# Patient Record
Sex: Female | Born: 1980 | Race: Black or African American | Hispanic: No | Marital: Single | Smoking: Never smoker
Health system: Southern US, Community
[De-identification: ages and names within clinical notes are randomized; demographics above are authoritative.]

## PROBLEM LIST (undated history)

## (undated) ENCOUNTER — Inpatient Hospital Stay (HOSPITAL_COMMUNITY): Payer: Self-pay

## (undated) DIAGNOSIS — N200 Calculus of kidney: Secondary | ICD-10-CM

## (undated) HISTORY — PX: OTHER SURGICAL HISTORY: SHX169

---

## 2018-10-21 ENCOUNTER — Encounter (HOSPITAL_COMMUNITY): Payer: Self-pay | Admitting: Emergency Medicine

## 2018-10-21 ENCOUNTER — Other Ambulatory Visit: Payer: Self-pay

## 2018-10-21 DIAGNOSIS — O26891 Other specified pregnancy related conditions, first trimester: Secondary | ICD-10-CM | POA: Insufficient documentation

## 2018-10-21 DIAGNOSIS — Z3A01 Less than 8 weeks gestation of pregnancy: Secondary | ICD-10-CM | POA: Insufficient documentation

## 2018-10-21 DIAGNOSIS — R1032 Left lower quadrant pain: Secondary | ICD-10-CM | POA: Insufficient documentation

## 2018-10-21 MED ORDER — SODIUM CHLORIDE 0.9% FLUSH: 3.0000 mL | Freq: Once | INTRAVENOUS | Status: DC

## 2018-10-21 NOTE — ED Triage Notes (Signed)
Per family, patient c/o lower abdominal pain x3 days. Denies N/V/D. Reports blood in urine only in the morning x3 days. Patient is french speaking.

## 2018-10-22 ENCOUNTER — Emergency Department (HOSPITAL_COMMUNITY)
Admission: EM | Admit: 2018-10-22 | Discharge: 2018-10-22 | Disposition: A | Discharge status: Home / Self Care | Payer: Self-pay | Attending: Emergency Medicine | Admitting: Emergency Medicine

## 2018-10-22 DIAGNOSIS — Z3A01 Less than 8 weeks gestation of pregnancy: Secondary | ICD-10-CM

## 2018-10-22 DIAGNOSIS — R1030 Lower abdominal pain, unspecified: Secondary | ICD-10-CM

## 2018-10-22 LAB — CBC
HCT: 41 % (ref 36.0–46.0)
Hemoglobin: 13.7 g/dL (ref 12.0–15.0)
MCH: 31.1 pg (ref 26.0–34.0)
MCHC: 33.4 g/dL (ref 30.0–36.0)
MCV: 93.2 fL (ref 80.0–100.0)
Platelets: 455 10*3/uL — ABNORMAL HIGH (ref 150–400)
RBC: 4.4 MIL/uL (ref 3.87–5.11)
RDW: 12.6 % (ref 11.5–15.5)
WBC: 6.7 10*3/uL (ref 4.0–10.5)
nRBC: 0 % (ref 0.0–0.2)

## 2018-10-22 LAB — WET PREP, GENITAL
Clue Cells Wet Prep HPF POC: NONE SEEN
SPERM: NONE SEEN
Trich, Wet Prep: NONE SEEN
Yeast Wet Prep HPF POC: NONE SEEN

## 2018-10-22 LAB — URINALYSIS, ROUTINE W REFLEX MICROSCOPIC
Bilirubin Urine: NEGATIVE
GLUCOSE, UA: NEGATIVE mg/dL
Ketones, ur: NEGATIVE mg/dL
Leukocytes,Ua: NEGATIVE
Nitrite: NEGATIVE
Protein, ur: NEGATIVE mg/dL
Specific Gravity, Urine: 1.013 (ref 1.005–1.030)
pH: 6 (ref 5.0–8.0)

## 2018-10-22 LAB — COMPREHENSIVE METABOLIC PANEL
ALT: 8 U/L (ref 0–44)
AST: 16 U/L (ref 15–41)
Albumin: 4.5 g/dL (ref 3.5–5.0)
Alkaline Phosphatase: 52 U/L (ref 38–126)
Anion gap: 7 (ref 5–15)
BUN: 7 mg/dL (ref 6–20)
CO2: 25 mmol/L (ref 22–32)
CREATININE: 0.6 mg/dL (ref 0.44–1.00)
Calcium: 9.8 mg/dL (ref 8.9–10.3)
Chloride: 104 mmol/L (ref 98–111)
GFR calc Af Amer: >60 mL/min (ref >60)
GFR calc non Af Amer: >60 mL/min (ref >60)
Glucose, Bld: 101 mg/dL — ABNORMAL HIGH (ref 70–99)
Potassium: 3.7 mmol/L (ref 3.5–5.1)
Sodium: 136 mmol/L (ref 135–145)
TOTAL PROTEIN: 7.9 g/dL (ref 6.5–8.1)
Total Bilirubin: 0.5 mg/dL (ref 0.3–1.2)

## 2018-10-22 LAB — I-STAT BETA HCG BLOOD, ED (MC, WL, AP ONLY): I-stat hCG, quantitative: >2000 m[IU]/mL — ABNORMAL HIGH (ref <5)

## 2018-10-22 LAB — LIPASE, BLOOD: Lipase: 33 U/L (ref 11–51)

## 2018-10-22 MED ORDER — PRENATAL VITAMINS 28-0.8 MG PO TABS: 1.0000 | ORAL_TABLET | Freq: Every day | ORAL | 0 refills | Status: DC

## 2018-10-22 NOTE — ED Provider Notes (Signed)
[facility]-EMERGENCY DEPT Provider Note   CSN: [id number] Arrival date & time: 10/21/18  1945    History   Chief Complaint Chief Complaint  Patient presents with  . Abdominal Pain    HPI Debbie Haley is a 38 y.o. female.     The history is provided by the patient. A language interpreter was used [phone]).  Abdominal Pain  Pain location:  LLQ and RLQ Pain quality: aching   Pain severity:  Moderate Onset quality:  Gradual Duration:  3 days Timing:  Intermittent Progression:  Waxing and waning Chronicity:  New Relieved by:  None tried Worsened by:  Nothing Associated symptoms: hematuria   Associated symptoms: no fever, no vaginal bleeding, no vaginal discharge and no vomiting   pt is currently pain free She knew that she is pregnant but has not seen OBGYN   PMH-none Soc hx - nonsmoker  Surgical hx - GB surgery She has 3 children LMP - 08/28/2018 Used to live in [place] OB History   No obstetric history on file.      Home Medications    Prior to Admission medications   Not on File    Family History No family history on file.  Social History Social History   Tobacco Use  . Smoking status: Not on file  Substance Use Topics  . Alcohol use: Not on file  . Drug use: Not on file     Allergies   Patient has no known allergies.   Review of Systems Review of Systems  Constitutional: Negative for fever.  Gastrointestinal: Positive for abdominal pain. Negative for vomiting.  Genitourinary: Positive for hematuria. Negative for vaginal bleeding and vaginal discharge.  All other systems reviewed and are negative.    Physical Exam Updated Vital Signs BP 138/85 (BP Location: Right Arm)   Pulse 87   Temp 98.3 F (36.8 C) (Oral)   Resp 19   Ht 1.6 m (5\' 3" )   Wt 82.6 kg   LMP 08/28/2018   SpO2 100%   BMI 32.24 kg/m   Physical Exam CONSTITUTIONAL: Well developed/well nourished HEAD: Normocephalic/atraumatic EYES:  EOMI/PERRL ENMT: Mucous membranes moist NECK: supple no meningeal signs SPINE/BACK:entire spine nontender CV: S1/S2 noted, no murmurs/rubs/gallops noted LUNGS: Lungs are clear to auscultation bilaterally, no apparent distress ABDOMEN: soft, nontender, no rebound or guarding, bowel sounds noted throughout abdomen GU:no cva tenderness, no CMT, no vaginal bleeding.  No products of conception.  No vaginal discharge.  No adnexal tenderness or mass.  Female nurse chaperone present for exam NEURO: Pt is awake/alert/appropriate, moves all extremitiesx4.  No facial droop.   EXTREMITIES: pulses normal/equal, full ROM SKIN: warm, color normal PSYCH: no abnormalities of mood noted, alert and oriented to situation   ED Treatments / Results  Labs (all labs ordered are listed, but only abnormal results are displayed) Labs Reviewed  WET PREP, GENITAL - Abnormal; Notable for the following components:      Result Value   WBC, Wet Prep HPF POC FEW (*)    All other components within normal limits  COMPREHENSIVE METABOLIC PANEL - Abnormal; Notable for the following components:   Glucose, Bld 101 (*)    All other components within normal limits  CBC - Abnormal; Notable for the following components:   Platelets 455 (*)    All other components within normal limits  URINALYSIS, ROUTINE W REFLEX MICROSCOPIC - Abnormal; Notable for the following components:   APPearance HAZY (*)    Hgb urine dipstick SMALL (*)  Bacteria, UA RARE (*)    All other components within normal limits  I-STAT BETA HCG BLOOD, ED ([facility], [facility], [facility] ONLY) - Abnormal; Notable for the following components:   I-stat hCG, quantitative >2,000.0 (*)    All other components within normal limits  LIPASE, BLOOD  GC/CHLAMYDIA PROBE AMP ([place]) NOT AT [facility]    EKG None  Radiology No results found.  Procedures Ultrasound ED OB Pelvic Date/Time: 10/22/2018 2:36 AM Performed by: Zadie Rhine, MD Authorized by: Zadie Rhine, MD     Procedure details:    Indications: evaluate for IUP     Assess:  Intrauterine pregnancy   Technique:  Transabdominal obstetric (HCG+) exam   Images: archived   Study Limitations: body habitus Uterine findings:    Yolk sac: identified     Estimated gestational age: 74 weeks    Medications Ordered in ED Medications  sodium chloride flush (NS) 0.9 % injection 3 mL (has no administration in time range)     Initial Impression / Assessment and Plan / ED Course  I have reviewed the triage vital signs and the nursing notes.  Pertinent labs & imaging results that were available during my care of the patient were reviewed by me and considered in my medical decision making (see chart for details).        2:36 AM Patient reports lower abdominal pain intermittently for the past 3 days.  She reports she is noted blood in her urine.  She denies any vaginal complaints.  She is currently pain-free. Limited bedside ultrasound reveals IUP with yolk sac.  Approximately 6 weeks.  By LMP she is 7 weeks. 3:42 AM Patient stable.  No new pain complaints.  She will be discharged home. She reports she will be heading back to [place] in the next month, but would like referral to local OB/GYN Final Clinical Impressions(s) / ED Diagnoses   Final diagnoses:  Lower abdominal pain  Less than [redacted] weeks gestation of pregnancy    ED Discharge Orders    None       Zadie Rhine, MD 10/22/18 [phone]

## 2018-10-22 NOTE — ED Notes (Signed)
Pelvic cart ready. 

## 2018-10-23 LAB — GC/CHLAMYDIA PROBE AMP (~~LOC~~) NOT AT ARMC
Chlamydia: NEGATIVE
Neisseria Gonorrhea: NEGATIVE

## 2018-11-05 ENCOUNTER — Encounter (HOSPITAL_COMMUNITY): Payer: Self-pay | Admitting: *Deleted

## 2018-11-05 ENCOUNTER — Other Ambulatory Visit: Payer: Self-pay

## 2018-11-05 ENCOUNTER — Inpatient Hospital Stay (HOSPITAL_COMMUNITY): Payer: Self-pay

## 2018-11-05 ENCOUNTER — Inpatient Hospital Stay (HOSPITAL_COMMUNITY)
Admission: AD | Admit: 2018-11-05 | Discharge: 2018-11-05 | Disposition: A | Discharge status: Home / Self Care | Payer: Self-pay | Attending: Obstetrics and Gynecology | Admitting: Obstetrics and Gynecology

## 2018-11-05 DIAGNOSIS — O209 Hemorrhage in early pregnancy, unspecified: Secondary | ICD-10-CM

## 2018-11-05 DIAGNOSIS — Z3A09 9 weeks gestation of pregnancy: Secondary | ICD-10-CM | POA: Insufficient documentation

## 2018-11-05 DIAGNOSIS — Z6790 Unspecified blood type, Rh positive: Secondary | ICD-10-CM

## 2018-11-05 DIAGNOSIS — O360110 Maternal care for anti-D [Rh] antibodies, first trimester, not applicable or unspecified: Secondary | ICD-10-CM

## 2018-11-05 DIAGNOSIS — O200 Threatened abortion: Secondary | ICD-10-CM | POA: Insufficient documentation

## 2018-11-05 HISTORY — DX: Calculus of kidney: N20.0

## 2018-11-05 LAB — CBC
HCT: 39.9 % (ref 36.0–46.0)
Hemoglobin: 13.1 g/dL (ref 12.0–15.0)
MCH: 29.4 pg (ref 26.0–34.0)
MCHC: 32.8 g/dL (ref 30.0–36.0)
MCV: 89.5 fL (ref 80.0–100.0)
Platelets: 387 10*3/uL (ref 150–400)
RBC: 4.46 MIL/uL (ref 3.87–5.11)
RDW: 11.9 % (ref 11.5–15.5)
WBC: 4.3 10*3/uL (ref 4.0–10.5)
nRBC: 0 % (ref 0.0–0.2)

## 2018-11-05 LAB — URINALYSIS, ROUTINE W REFLEX MICROSCOPIC
Bacteria, UA: NONE SEEN
Bilirubin Urine: NEGATIVE
Glucose, UA: NEGATIVE mg/dL
Ketones, ur: NEGATIVE mg/dL
Leukocytes,Ua: NEGATIVE
Nitrite: NEGATIVE
Protein, ur: NEGATIVE mg/dL
Specific Gravity, Urine: 1.015 (ref 1.005–1.030)
pH: 6 (ref 5.0–8.0)

## 2018-11-05 LAB — ABO/RH: ABO/RH(D): A POS

## 2018-11-05 MED ORDER — PRENATAL VITAMINS 28-0.8 MG PO TABS: 1.0000 | ORAL_TABLET | Freq: Every day | ORAL | 0 refills | Status: DC

## 2018-11-05 MED ORDER — PRENATAL VITAMINS 28-0.8 MG PO TABS: 1.0000 | ORAL_TABLET | Freq: Every day | ORAL | 5 refills | Status: AC

## 2018-11-05 MED ORDER — TRAMADOL HCL 50 MG PO TABS: 50.0000 mg | ORAL_TABLET | Freq: Four times a day (QID) | ORAL | 0 refills | Status: DC | PRN

## 2018-11-05 MED ORDER — TRAMADOL HCL 50 MG PO TABS: 50.0000 mg | ORAL_TABLET | Freq: Four times a day (QID) | ORAL | 0 refills | Status: AC | PRN

## 2018-11-05 NOTE — MAU Provider Note (Signed)
Chief Complaint: Vaginal Bleeding and Abdominal Pain   First Provider Initiated Contact with Patient 11/05/18 1052      SUBJECTIVE HPI: Debbie Haley is a 38 y.o. G4P3003 at [redacted]w[redacted]d by LMP who presents to maternity admissions reporting bleeding with urinating and abdominal cramping associated with urinating.  She was seen in the ED on 10/22/18 with cramping and had bedside US with gestational sac and yolk sac, measuring 6 weeks by gestational sac size.  Her pain improved and there was no bleeding until today.  This morning, she started having light red bleeding when wiping and mild lower abdominal cramping after each time she urinates. She denies pain now.  There are no other symptoms. She has not tried any treatments.    HPI  Past Medical History:  Diagnosis Date  . Kidney stones    Past Surgical History:  Procedure Laterality Date  . OTHER SURGICAL HISTORY     removal of kidney stone   Social History   Socioeconomic History  . Marital status: Single    Spouse name: Not on file  . Number of children: Not on file  . Years of education: Not on file  . Highest education level: Not on file  Occupational History  . Not on file  Social Needs  . Financial resource strain: Not on file  . Food insecurity:    Worry: Not on file    Inability: Not on file  . Transportation needs:    Medical: Not on file    Non-medical: Not on file  Tobacco Use  . Smoking status: Never Smoker  . Smokeless tobacco: Never Used  Substance and Sexual Activity  . Alcohol use: Never    Frequency: Never  . Drug use: Never  . Sexual activity: Yes  Lifestyle  . Physical activity:    Days per week: Not on file    Minutes per session: Not on file  . Stress: Not on file  Relationships  . Social connections:    Talks on phone: Not on file    Gets together: Not on file    Attends religious service: Not on file    Active member of club or organization: Not on file    Attends meetings of clubs or organizations:  Not on file    Relationship status: Not on file  . Intimate partner violence:    Fear of current or ex partner: Not on file    Emotionally abused: Not on file    Physically abused: Not on file    Forced sexual activity: Not on file  Other Topics Concern  . Not on file  Social History Narrative  . Not on file   No current facility-administered medications on file prior to encounter.    Current Outpatient Medications on File Prior to Encounter  Medication Sig Dispense Refill  . Prenatal Vit-Fe Fumarate-FA (PRENATAL VITAMINS) 28-0.8 MG TABS Take 1 tablet by mouth daily. 30 tablet 0   No Known Allergies  ROS:  Review of Systems  Constitutional: Negative for chills, fatigue and fever.  Respiratory: Negative for shortness of breath.   Cardiovascular: Negative for chest pain.  Gastrointestinal: Positive for abdominal pain.  Genitourinary: Positive for dysuria and vaginal bleeding. Negative for difficulty urinating, flank pain, pelvic pain, vaginal discharge and vaginal pain.  Neurological: Negative for dizziness and headaches.  Psychiatric/Behavioral: Negative.      I have reviewed patient's Past Medical Hx, Surgical Hx, Family Hx, Social Hx, medications and allergies.   Physical Exam  Patient Vitals for the past 24 hrs:  BP Temp Temp src Pulse Resp SpO2 Height Weight  11/05/18 0938 133/86 98.2 F (36.8 C) Oral (!) 102 18 100 % - 81.2 kg   Constitutional: Well-developed, well-nourished female in no acute distress.  Cardiovascular: normal rate Respiratory: normal effort GI: Abd soft, non-tender. Pos BS x 4 MS: Extremities nontender, no edema, normal ROM Neurologic: Alert and oriented x 4.  GU: Neg CVAT.  PELVIC EXAM: Deferred, done 10/22/18 with cultures   LAB RESULTS Results for orders placed or performed during the hospital encounter of 11/05/18 (from the past 24 hour(s))  CBC     Status: None   Collection Time: 11/05/18 10:32 AM  Result Value Ref Range   WBC 4.3  4.0 - 10.5 K/uL   RBC 4.46 3.87 - 5.11 MIL/uL   Hemoglobin 13.1 12.0 - 15.0 g/dL   HCT 16.1 09.6 - 04.5 %   MCV 89.5 80.0 - 100.0 fL   MCH 29.4 26.0 - 34.0 pg   MCHC 32.8 30.0 - 36.0 g/dL   RDW 40.9 81.1 - 91.4 %   Platelets 387 150 - 400 K/uL   nRBC 0.0 0.0 - 0.2 %  ABO/Rh     Status: None   Collection Time: 11/05/18 10:32 AM  Result Value Ref Range   ABO/RH(D) A POS    No rh immune globuloin      NOT A RH IMMUNE GLOBULIN CANDIDATE, PT RH POSITIVE Performed at [facility], [address] [place], [place] [address]     --/--/A POS (03/05 1032)  IMAGING  US Ob Less Than 14 Weeks With Ob Transvaginal  Result Date: 11/05/2018 CLINICAL DATA:  Vaginal bleeding in 1st trimester pregnancy. EXAM: OBSTETRIC <14 WK [place] AND TRANSVAGINAL OB US TECHNIQUE: Both transabdominal and transvaginal ultrasound examinations were performed for complete evaluation of the gestation as well as the maternal uterus, adnexal regions, and pelvic cul-de-sac. Transvaginal technique was performed to assess early pregnancy. COMPARISON:  None. FINDINGS: Intrauterine gestational sac: Single; irregular sac shape noted Yolk sac:  Visualized. Embryo:  Not Visualized. MSD: 15 mm   6 w   2 d Subchorionic hemorrhage:  None visualized. Maternal uterus/adnexae: Both ovaries are normal in appearance. No mass or abnormal free fluid identified. IMPRESSION: Single intrauterine gestational sac measuring 6 weeks 2 days by mean sac diameter. Irregular sac shape is a poor prognostic sign. Suggest correlation with serial b-hCG levels, and consider followup ultrasound to assess viability in 10 days. Electronically Signed   By: Myles Rosenthal M.D.   On: 11/05/2018 12:22   MAU Management/MDM: Orders Placed This Encounter  Procedures  . US OB Transvaginal  . Urinalysis, Routine w reflex microscopic  . CBC  . ABO/Rh    No orders of the defined types were placed in this encounter.   [place] today suspicious for but not definitive for  failed pregnancy.  Discussed results with pt, questions answered. Outpatient [place] in 10 days for confirmation. Pt to return to MAU with heavy bleeding or severe pain. Pt concerned about pain at home.  Rx for Tramadol 50 mg Q 6 hours PRN x 10 tabs only. Pt discharged with strict bleeding/miscarriage precautions.  ASSESSMENT  1. Threatened miscarriage in early pregnancy   2. Vaginal bleeding in pregnancy, first trimester   3. Blood type, Rh positive     PLAN Discharge home    Allergies as of 11/05/2018   No Known Allergies     Medication List  TAKE these medications   Prenatal Vitamins 28-0.8 MG Tabs Take 1 tablet by mouth daily. What changed:  You were already taking a medication with the same name, and this prescription was added. Make sure you understand how and when to take each.   Prenatal Vitamins 28-0.8 MG Tabs Take 1 tablet by mouth daily. What changed:  Another medication with the same name was added. Make sure you understand how and when to take each.   traMADol 50 MG tablet Commonly known as:  ULTRAM Take 1 tablet (50 mg total) by mouth every 6 (six) hours as needed.       Sharen Counter Certified Nurse-Midwife 11/05/2018  11:40 AM

## 2018-11-05 NOTE — Discharge Instructions (Signed)
Fausse couche menace   Une fausse couche menace survient lorsqu'une femme a des saignements vaginaux au cours des 20 premires semaines de grossesse mais que la grossesse n'est pas termine. Si vous avez des saignements vaginaux pendant cette priode, votre professionnel de la sant fera des tests pour vous assurer que vous tes toujours enceinte. Si les tests montrent que vous tes toujours enceinte et [employer] bb en dveloppement (ftus)  l'intrieur de votre utrus continue de grandir, votre tat est considr comme une fausse couche menace. Une fausse couche menace ne signifie pas que votre grossesse prendra fin, mais elle augmente le risque de perdre votre grossesse (fausse couche complte). Quelles en [employer] causes? La cause de cette condition n'est gnralement pas connue. Pour les femmes qui font une fausse couche complte, la cause la plus frquente est un nombre anormal de chromosomes chez le bb en dveloppement. Les chromosomes [employer] structures  l'intrieur des cellules qui contiennent tout le matriel gntique [place]. Qu'est-ce qui [employer]? Les facteurs de style de vie suivants peuvent augmenter votre risque de fausse couche en dbut de grossesse:  Fumer.  Boire des [employer] ou de cafine.  Consommation de drogues  des fins rcratives. Les problmes de sant prexistants suivants peuvent augmenter le risque de fausse couche en dbut de grossesse:  Syndrome des [employer].  Fibromes utrins.  Infections.  Diabte sucr. Quels sont les signes ou symptmes? Les symptmes de cette condition comprennent:  Saignement vaginal.  Douleurs abdominales ou crampes lgres. Comment est-ce diagnostiqu? Si vous avez des [employer] ou sans douleur abdominale avant 20 semaines de [place], votre professionnel de la sant fera des tests pour vrifier si vous tes encore enceinte. Ceux-ci comprendront:  chographie.  Ce test utilise des [employer] crer [employer] de l'intrieur de votre utrus. Cela permet  votre fournisseur de soins de sant de regarder votre bb en dveloppement et d'autres structures, comme votre placenta.  Examen pelvien. Il s'agit d'un examen interne de votre vagin et de votre col de l'utrus.  Mesure de la frquence cardiaque de votre bb.  Des tests de laboratoire tels que des tests sanguins, des tests d'urine ou des couvillons pour l'infection Vous pouvez tre [employer] menace de fausse couche si:  L'chographie montre que vous tes toujours enceinte.  La frquence cardiaque de votre bb est forte.  Un examen pelvien montre que l'ouverture entre votre utrus et votre vagin (col de l'utrus) est ferme.  Les analyses de sang confirment que vous tes toujours enceinte. Comment est-ce trait? Aucun traitement n'a t dmontr pour [employer] fausse couche menace de Ambulance person en fausse couche complte. Cependant, les bons soins  domicile [employer]. Suivez ces instructions  la maison:  Prenez beaucoup de repos.  N'ayez pas de relations sexuelles et n'utilisez pas de tampons si vous avez des saignements vaginaux.  Ne douchez pas.  Ne fumez pas et n'utilisez pas de drogues rcratives.  Ne buvez pas d'alcool.  vitez la cafine.  Gardez toutes les visites prnatales de suivi comme indiqu par votre fournisseur de soins de sant. C'est important. Contactez un fournisseur de Conservator, museum/gallery de sant si:  Vous avez des saignements vaginaux lgers ou des Arts development officer.  Vous avez des [employer] ou des crampes.  Tu as de Social research officer, government. [place] de l'aide immdiatement si:  Vous avez des saignements vaginaux abondants.  Vous avez des caillots sanguins provenant de votre vagin.  Vous passez des tissus de  votre vagin.  Vous fuyez du liquide ou vous avez un jet de [employer] vagin.  Vous avez de graves douleurs au  bas du dos ou des crampes abdominales.  Vous avez de la fivre, des frissons et de fortes douleurs abdominales. Sommaire  Une fausse couche menace survient lorsqu'une femme a des saignements vaginaux au cours des 20 premires semaines de grossesse mais que la grossesse n'est pas termine.  La cause d'une menace de fausse couche n'est gnralement pas connue.  Les symptmes de cette affection peuvent inclure des saignements vaginaux et de lgres douleurs ou crampes abdominales.  Aucun traitement n'a t dmontr pour [employer] fausse couche menace de Ambulance person en fausse couche complte.  Gardez toutes les visites prnatales de suivi comme indiqu par votre fournisseur de soins de sant. C'est important. Ces informations ne sont pas destines  remplacer les conseils donns par votre fournisseur de soins de sant. Assurez-vous de Scientific laboratory technician de vos questions avec votre professionnel de la sant.

## 2018-11-05 NOTE — MAU Note (Signed)
Is pregnant. This morning when she went to the bathroom, she noted blood (looked like a period mixed with water)and is having abd pain. (lower abd, like period cramps)

## 2018-11-06 ENCOUNTER — Inpatient Hospital Stay (HOSPITAL_COMMUNITY)
Admission: AD | Admit: 2018-11-06 | Discharge: 2018-11-06 | Disposition: A | Discharge status: Home / Self Care | Payer: Self-pay | Attending: Obstetrics and Gynecology | Admitting: Obstetrics and Gynecology

## 2018-11-06 ENCOUNTER — Other Ambulatory Visit: Payer: Self-pay

## 2018-11-06 DIAGNOSIS — O200 Threatened abortion: Secondary | ICD-10-CM

## 2018-11-06 DIAGNOSIS — O4691 Antepartum hemorrhage, unspecified, first trimester: Secondary | ICD-10-CM | POA: Insufficient documentation

## 2018-11-06 DIAGNOSIS — Z3A10 10 weeks gestation of pregnancy: Secondary | ICD-10-CM | POA: Insufficient documentation

## 2018-11-06 LAB — HCG, QUANTITATIVE, PREGNANCY: hCG, Beta Chain, Quant, S: 27429 m[IU]/mL — ABNORMAL HIGH

## 2018-11-06 NOTE — MAU Note (Signed)
Here for the same reason.  She is still bleeding, she is not "healed".  Pt does not understand.   (attempting to explain "threatened miscarriage" through translator, that we do not have a definite answer, there is not a treatment).  Bleeding is heavier, passing blood clots now, is like a period when she urinates. cramping

## 2018-11-06 NOTE — MAU Provider Note (Signed)
[name] [phone]   S: Debbie Haley is a 38 y.o. G4P3003 at [redacted]w[redacted]d by LMP, but 6.2 weeks by [place] on 11/05/2018.  She presents today for continued vaginal bleeding.  Patient speaks [place] and interpretations were completed with assistance of [name] [phone] and later by patient's sister Debbie Haley.  She reports that she continues to bleed and expresses concern about the pregnancy.  Patient states she attempted to schedule an [place], but was unable to contact anyone.  She denies pain at current and endorses that the bleeding is no more than yesterday.    O: Vitals:   11/06/18 1620  BP: 122/75  Pulse: 83  Resp: 18  Temp: 98.3 F (36.8 C)  SpO2: 100%   US FINDINGS from 11/05/2018: Intrauterine gestational sac: Single; irregular sac shape noted  Yolk sac:  Visualized.  Embryo:  Not Visualized.  MSD: 15 mm   6 w   2 d  Subchorionic hemorrhage:  None visualized.  Maternal uterus/adnexae: Both ovaries are normal in appearance. No mass or abnormal free fluid identified.  IMPRESSION: Single intrauterine gestational sac measuring 6 weeks 2 days by mean sac diameter. Irregular sac shape is a poor prognostic sign. Suggest correlation with serial b-hCG levels, and consider follow up ultrasound to assess viability in 10 days.  Results for orders placed or performed during the hospital encounter of 11/06/18 (from the past 24 hour(s))  hCG, quantitative, pregnancy     Status: Abnormal   Collection Time: 11/06/18  3:54 PM  Result Value Ref Range   hCG, Beta Chain, Quant, S 27,429 (H) <5 mIU/mL    A:  Intrauterine Gestational Sac Unknown Quant Level Vaginal Bleeding Language Barrier  P: -Extensive discussion regarding US findings and need for follow up.  -Patient questions regarding pregnancy and bleeding addressed and patient informed that based on LMP and [place] yesterday inconsistencies noted. -Patient informed that she could possibly be having a miscarriage, but additional testing is  necessary. -Patient informed that [place] will be scheduled after quants obtained and possible necessity for serial testing discussed. -Bleeding precautions given. -Patient scheduled on Monday at 1330 for repeat quant. -Quant ordered, collected, and pending. -Patient questions and advised to take PNV as ordered. -Discussed pelvic rest and patient reports her husband is not present currently.  -Patient without further questions or concerns. -Patient discharged to home in stable condition.  Cherre Robins, CNM 11/06/2018 4:31 PM   Addendum 5:28 PM -hCG returns at 27k -Attempted to contact patient regarding results and no need for follow up hCG. -Numbers on file, including sisters number called with no messages left. -Will send message to clinic for further attempts at contacting.   -Will order [place] for ~2 weeks from today.   Cherre Robins MSN, CNM

## 2018-11-09 ENCOUNTER — Other Ambulatory Visit (INDEPENDENT_AMBULATORY_CARE_PROVIDER_SITE_OTHER): Payer: Self-pay | Admitting: Obstetrics & Gynecology

## 2018-11-09 ENCOUNTER — Other Ambulatory Visit: Payer: Self-pay

## 2018-11-09 ENCOUNTER — Inpatient Hospital Stay (HOSPITAL_COMMUNITY)
Admission: AD | Admit: 2018-11-09 | Discharge: 2018-11-09 | Disposition: A | Discharge status: Home / Self Care | Payer: Self-pay | Attending: Obstetrics and Gynecology | Admitting: Obstetrics and Gynecology

## 2018-11-09 ENCOUNTER — Encounter (HOSPITAL_COMMUNITY): Payer: Self-pay | Admitting: *Deleted

## 2018-11-09 ENCOUNTER — Inpatient Hospital Stay (HOSPITAL_COMMUNITY): Payer: Self-pay

## 2018-11-09 ENCOUNTER — Other Ambulatory Visit (HOSPITAL_COMMUNITY)
Admission: RE | Admit: 2018-11-09 | Discharge: 2018-11-09 | Disposition: A | Discharge status: Home / Self Care | Payer: Self-pay | Source: Ambulatory Visit | Attending: Family Medicine | Admitting: Family Medicine

## 2018-11-09 VITALS — BP 138/90 | HR 99

## 2018-11-09 DIAGNOSIS — O360921 Maternal care for other rhesus isoimmunization, second trimester, fetus 1: Secondary | ICD-10-CM

## 2018-11-09 DIAGNOSIS — O209 Hemorrhage in early pregnancy, unspecified: Secondary | ICD-10-CM

## 2018-11-09 DIAGNOSIS — O26891 Other specified pregnancy related conditions, first trimester: Secondary | ICD-10-CM

## 2018-11-09 DIAGNOSIS — N939 Abnormal uterine and vaginal bleeding, unspecified: Secondary | ICD-10-CM

## 2018-11-09 DIAGNOSIS — O039 Complete or unspecified spontaneous abortion without complication: Secondary | ICD-10-CM

## 2018-11-09 DIAGNOSIS — Z3A10 10 weeks gestation of pregnancy: Secondary | ICD-10-CM

## 2018-11-09 DIAGNOSIS — O200 Threatened abortion: Secondary | ICD-10-CM | POA: Insufficient documentation

## 2018-11-09 DIAGNOSIS — Z6790 Unspecified blood type, Rh positive: Secondary | ICD-10-CM

## 2018-11-09 DIAGNOSIS — R102 Pelvic and perineal pain: Secondary | ICD-10-CM

## 2018-11-09 LAB — COMPREHENSIVE METABOLIC PANEL
ALT: 7 U/L (ref 0–44)
AST: 17 U/L (ref 15–41)
Albumin: 4 g/dL (ref 3.5–5.0)
Alkaline Phosphatase: 49 U/L (ref 38–126)
Anion gap: 7 (ref 5–15)
BUN: 6 mg/dL (ref 6–20)
CHLORIDE: 105 mmol/L (ref 98–111)
CO2: 23 mmol/L (ref 22–32)
Calcium: 9.4 mg/dL (ref 8.9–10.3)
Creatinine, Ser: 0.6 mg/dL (ref 0.44–1.00)
GFR calc Af Amer: >60 mL/min (ref >60)
Glucose, Bld: 116 mg/dL — ABNORMAL HIGH (ref 70–99)
POTASSIUM: 3.4 mmol/L — AB (ref 3.5–5.1)
Sodium: 135 mmol/L (ref 135–145)
Total Bilirubin: 0.3 mg/dL (ref 0.3–1.2)
Total Protein: 7.2 g/dL (ref 6.5–8.1)

## 2018-11-09 LAB — CBC
HCT: 38.7 % (ref 36.0–46.0)
Hemoglobin: 13.4 g/dL (ref 12.0–15.0)
MCH: 30.9 pg (ref 26.0–34.0)
MCHC: 34.6 g/dL (ref 30.0–36.0)
MCV: 89.2 fL (ref 80.0–100.0)
PLATELETS: 392 10*3/uL (ref 150–400)
RBC: 4.34 MIL/uL (ref 3.87–5.11)
RDW: 11.8 % (ref 11.5–15.5)
WBC: 6.3 10*3/uL (ref 4.0–10.5)
nRBC: 0 % (ref 0.0–0.2)

## 2018-11-09 LAB — HCG, QUANTITATIVE, PREGNANCY: HCG, BETA CHAIN, QUANT, S: 18144 m[IU]/mL — AB (ref <5)

## 2018-11-09 MED ORDER — IBUPROFEN 200 MG PO TABS
600.0000 mg | ORAL_TABLET | Freq: Once | ORAL | Status: AC
  Administered 2018-11-09: 600 mg via ORAL

## 2018-11-09 MED ORDER — LACTATED RINGERS IV BOLUS
1000.0000 mL | Freq: Once | INTRAVENOUS | Status: AC
  Administered 2018-11-09: 1000 mL via INTRAVENOUS

## 2018-11-09 MED ORDER — IBUPROFEN 600 MG PO TABS: 600.0000 mg | ORAL_TABLET | Freq: Four times a day (QID) | ORAL | 1 refills | Status: AC | PRN

## 2018-11-09 NOTE — MAU Note (Signed)
OK for pt to proceed to U/S before IV start per CNM.

## 2018-11-09 NOTE — Progress Notes (Addendum)
Here for stat bhcg. C/o pain= 8 in her lower back and pelvis C/o very heavy bleeding x2 days and feeling dizzy, head spinning, nausea. Discussed with Thressa Sheller , CNM. Orders given for routine bhcg , cbc and to come back in 2 days to review labs, see how she is, possible another lab and to offer her ibuprofen.  Explained the reccommendations to patient . She states wants to know if she has had miscarriage. Explained we can not confirm if she has had a miscarriage yet- but appears she is having miscarriage. She accepted ibuprofen. . Instructed her to go to [facility] MAU if she has heavy bleeding that saturates pad in one hour or less for 3 or more hours. .  She states she feels her bleeding is too heavy and is changing her pad every 30 minutes . I advised her to go to [facility] MAU for assessment now. She states she will go there now, has a friend or family member with her who will drive her there .   Linda,RN  Patient went to lobby to restroom and asked for bleeding to be checked. Discussed with provider and brought patient to exam room to be examined by provider. Linda,RN

## 2018-11-09 NOTE — MAU Provider Note (Signed)
History     CSN: [id number]  Arrival date and time: 11/09/18 1558   None     Chief Complaint  Patient presents with  . Abdominal Pain  . Vaginal Bleeding   Ms. Debbie Haley is a 38 y.o. G4P3003 at [redacted]w[redacted]d who presents to MAU for heavy vaginal bleeding. Pt was seen in clinic this morning and sent by provider for evaluation after blood, clots and tissue evacuated from vagina in office without cessation of bleeding. Pt dx with SAB on 11/05/2018. Office provider requesting CBC, hCG, TVUS.  Pt reports dizziness, lightheadedness, extreme fatigue, SOB, nausea. Denies vomiting, abdominal pain. Pt reports heavy bleeding and passing multiple clots between clinic and admission to MAU.  Sister present for entire visit, [place] interpreter/sister used for interpretation.   OB History    Gravida  4   Para  3   Term  3   Preterm  0   AB      Living  3     SAB      TAB      Ectopic      Multiple      Live Births  3        Obstetric Comments  Children born in [place]        Past Medical History:  Diagnosis Date  . Kidney stones     Past Surgical History:  Procedure Laterality Date  . OTHER SURGICAL HISTORY     removal of kidney stone    Family History  Problem Relation Age of Onset  . Hypertension Mother     Social History   Tobacco Use  . Smoking status: Never Smoker  . Smokeless tobacco: Never Used  Substance Use Topics  . Alcohol use: Never    Frequency: Never  . Drug use: Never    Allergies: No Known Allergies  Medications Prior to Admission  Medication Sig Dispense Refill Last Dose  . ibuprofen (ADVIL,MOTRIN) 600 MG tablet Take 1 tablet (600 mg total) by mouth every 6 (six) hours as needed. 30 tablet 1   . Prenatal Vit-Fe Fumarate-FA (PRENATAL VITAMINS) 28-0.8 MG TABS Take 1 tablet by mouth daily. 30 tablet 5   . Prenatal Vit-Fe Fumarate-FA (PRENATAL VITAMINS) 28-0.8 MG TABS Take 1 tablet by mouth daily. 30 tablet 0   . traMADol (ULTRAM) 50 MG  tablet Take 1 tablet (50 mg total) by mouth every 6 (six) hours as needed. 10 tablet 0     Review of Systems  Constitutional: Positive for fatigue. Negative for chills, diaphoresis and fever.  Gastrointestinal: Positive for nausea. Negative for abdominal distention, abdominal pain and vomiting.  Genitourinary: Positive for pelvic pain and vaginal bleeding. Negative for dysuria, flank pain and vaginal discharge.  Neurological: Positive for dizziness, weakness and light-headedness. Negative for syncope and facial asymmetry.   Physical Exam   Blood pressure 140/78, pulse 96, temperature 98 F (36.7 C), temperature source Oral, resp. rate 18, last menstrual period 08/28/2018.   Patient Vitals for the past 24 hrs:  BP Temp Temp src Pulse Resp  11/09/18 1853 140/78 - - 96 -  11/09/18 1753 (!) 141/89 - - (!) 140 -  11/09/18 1751 126/87 - - (!) 114 -  11/09/18 1750 130/84 - - (!) 110 -  11/09/18 1633 (!) 147/102 98 F (36.7 C) Oral (!) 109 18   No data found.     Physical Exam  Constitutional: She is oriented to person, place, and time. She appears well-developed and well-nourished. No  distress.  HENT:  Head: Normocephalic.  Cardiovascular: Regular rhythm. Tachycardia present.  Respiratory: Effort normal and breath sounds normal. No respiratory distress.  GI: Soft. She exhibits no distension and no mass. There is abdominal tenderness in the right upper quadrant. There is no rebound and no guarding.  Genitourinary: Cervix exhibits no discharge and no friability.    Vaginal bleeding present.  There is bleeding in the vagina.    Genitourinary Comments: Multiple large clots and frank blood evacuated from vagina, external os appears closed. After evacuation of contents, cervical os monitored visually for 45s without evidence of active bleeding.   Neurological: She is alert and oriented to person, place, and time.  Skin: Skin is warm and dry. She is not diaphoretic.  Psychiatric: She has a  normal mood and affect.   Results for orders placed or performed during the hospital encounter of 11/09/18 (from the past 24 hour(s))  CBC     Status: None   Collection Time: 11/09/18  4:20 PM  Result Value Ref Range   WBC 6.3 4.0 - 10.5 K/uL   RBC 4.34 3.87 - 5.11 MIL/uL   Hemoglobin 13.4 12.0 - 15.0 g/dL   HCT 68.1 27.5 - 17.0 %   MCV 89.2 80.0 - 100.0 fL   MCH 30.9 26.0 - 34.0 pg   MCHC 34.6 30.0 - 36.0 g/dL   RDW 01.7 49.4 - 49.6 %   Platelets 392 150 - 400 K/uL   nRBC 0.0 0.0 - 0.2 %  hCG, quantitative, pregnancy     Status: Abnormal   Collection Time: 11/09/18  4:20 PM  Result Value Ref Range   hCG, Beta Chain, Quant, S 18,144 (H) <5 mIU/mL  Comprehensive metabolic panel     Status: Abnormal   Collection Time: 11/09/18  5:23 PM  Result Value Ref Range   Sodium 135 135 - 145 mmol/L   Potassium 3.4 (L) 3.5 - 5.1 mmol/L   Chloride 105 98 - 111 mmol/L   CO2 23 22 - 32 mmol/L   Glucose, Bld 116 (H) 70 - 99 mg/dL   BUN 6 6 - 20 mg/dL   Creatinine, Ser 7.59 0.44 - 1.00 mg/dL   Calcium 9.4 8.9 - 16.3 mg/dL   Total Protein 7.2 6.5 - 8.1 g/dL   Albumin 4.0 3.5 - 5.0 g/dL   AST 17 15 - 41 U/L   ALT 7 0 - 44 U/L   Alkaline Phosphatase 49 38 - 126 U/L   Total Bilirubin 0.3 0.3 - 1.2 mg/dL   GFR calc non Af Amer >60 >60 mL/min   GFR calc Af Amer >60 >60 mL/min   Anion gap 7 5 - 15   US Ob Transvaginal  Result Date: 11/09/2018 CLINICAL DATA:  38 y/o  F; heavy bleeding and abdominal pain. EXAM: TRANSVAGINAL OB ULTRASOUND TECHNIQUE: Transvaginal ultrasound was performed for complete evaluation of the gestation as well as the maternal uterus, adnexal regions, and pelvic cul-de-sac. COMPARISON:  11/05/2018 pelvic ultrasound. FINDINGS: Intrauterine gestational sac: None Yolk sac:  Not Visualized. Embryo:  Not Visualized. Cardiac Activity: Not Visualized. Subchorionic hemorrhage:  None visualized. Maternal uterus/adnexae: Heterogeneous material is present within the endometrial canal which  may represent blood products. Additionally there is a small fluid collection in the lower uterine segment, no fetal pole. IMPRESSION: Probable spontaneous abortion in progress. Heterogeneous material within the endometrial canal is probably blood products. Small fluid collection without fetal pole in the lower uterine segment may represent the exiting gestational sac.  Electronically Signed   By: Mitzi Hansen M.D.   On: 11/09/2018 17:30   US Ob Less Than 14 Weeks With Ob Transvaginal  Result Date: 11/05/2018 CLINICAL DATA:  Vaginal bleeding in 1st trimester pregnancy. EXAM: OBSTETRIC <14 WK [place] AND TRANSVAGINAL OB US TECHNIQUE: Both transabdominal and transvaginal ultrasound examinations were performed for complete evaluation of the gestation as well as the maternal uterus, adnexal regions, and pelvic cul-de-sac. Transvaginal technique was performed to assess early pregnancy. COMPARISON:  None. FINDINGS: Intrauterine gestational sac: Single; irregular sac shape noted Yolk sac:  Visualized. Embryo:  Not Visualized. MSD: 15 mm   6 w   2 d Subchorionic hemorrhage:  None visualized. Maternal uterus/adnexae: Both ovaries are normal in appearance. No mass or abnormal free fluid identified. IMPRESSION: Single intrauterine gestational sac measuring 6 weeks 2 days by mean sac diameter. Irregular sac shape is a poor prognostic sign. Suggest correlation with serial b-hCG levels, and consider followup ultrasound to assess viability in 10 days. Electronically Signed   By: Myles Rosenthal M.D.   On: 11/05/2018 12:22    MAU Course  Procedures  MDM -suspect SAB in progress, pt experiencing symptoms of excessive blood loss/hypovolemia - 1L LR given -ABO: A positive (done 11/05/2018) -CBC WNL (H/H 13.4/38.7, platelets 392) -hCG 18,144, down from 27,429 on 11/06/2018 -[place]: no note of suspicion for retained POCs, no evidence of yolk sac or gestational sac as compared to [place] on 11/05/2018 -CMP WNL (liver enzymes  normal) -after evacuation of vaginal contents, minimal bleeding on new pad, pt reports using the restroom after without presence of clots or bleeding in toilet -pt reports resolution of sx of hypovolemia after administration of LR -RUQ tenderness resolved by end of stay in MAU -pt denies any sx or heavy vaginal bleeding prior to discharge -discharge to home with sister   Assessment and Plan   1. Miscarriage   2. Episode of heavy vaginal bleeding   3. Blood type, Rh positive    Allergies as of 11/09/2018   No Known Allergies     Medication List    TAKE these medications   ibuprofen 600 MG tablet Commonly known as:  ADVIL,MOTRIN Take 1 tablet (600 mg total) by mouth every 6 (six) hours as needed.   Prenatal Vitamins 28-0.8 MG Tabs Take 1 tablet by mouth daily. What changed:  Another medication with the same name was removed. Continue taking this medication, and follow the directions you see here.   traMADol 50 MG tablet Commonly known as:  ULTRAM Take 1 tablet (50 mg total) by mouth every 6 (six) hours as needed.      -bleeding precautions/miscarriage expectations discussed -questions answered to pt and sister satisfaction -f/u with clinic in 1wk for hCG, pt instructed to call clinic for appt and message sent to clinic inbox -discharge to home with sister in stable condition  Odie Sera Debany Vantol 11/09/2018, 7:31 PM

## 2018-11-09 NOTE — Addendum Note (Signed)
Addended by: Jaynie Collins A on: 11/09/2018 03:39 PM   Modules accepted: Orders, Level of Service

## 2018-11-09 NOTE — Discharge Instructions (Signed)
Miscarriage  A miscarriage is the loss of an unborn baby (fetus) before the 20th week of pregnancy.  Follow these instructions at home:  Medicines    · Take over-the-counter and prescription medicines only as told by your doctor.  · If you were prescribed antibiotic medicine, take it as told by your doctor. Do not stop taking the antibiotic even if you start to feel better.  · Do not take NSAIDs unless your doctor says that this is safe for you. NSAIDs include aspirin and ibuprofen. These medicines can cause bleeding.  Activity  · Rest as directed. Ask your doctor what activities are safe for you.  · Have someone help you at home during this time.  General instructions  · Write down how many pads you use each day and how soaked they are.  · Watch the amount of tissue or clumps of blood (blood clots) that you pass from your vagina. Save any large amounts of tissue for your doctor.  · Do not use tampons, douche, or have sex until your doctor approves.  · To help you and your partner with the process of grieving, talk with your doctor or seek counseling.  · When you are ready, meet with your doctor to talk about steps you should take for your health. Also, talk with your doctor about steps to take to have a healthy pregnancy in the future.  · Keep all follow-up visits as told by your doctor. This is important.  Contact a doctor if:  · You have a fever or chills.  · You have vaginal discharge that smells bad.  · You have more bleeding.  Get help right away if:  · You have very bad cramps or pain in your back or belly.  · You pass clumps of blood that are walnut-sized or larger from your vagina.  · You pass tissue that is walnut-sized or larger from your vagina.  · You soak more than 1 regular pad in an hour.  · You get light-headed or weak.  · You faint (pass out).  · You have feelings of sadness that do not go away, or you have thoughts of hurting yourself.  Summary  · A miscarriage is the loss of an unborn baby before  the 20th week of pregnancy.  · Follow your doctor's instructions for home care. Keep all follow-up appointments.  · To help you and your partner with the process of grieving, talk with your doctor or seek counseling.  This information is not intended to replace advice given to you by your health care provider. Make sure you discuss any questions you have with your health care provider.  Document Released: 11/11/2011 Document Revised: 09/24/2016 Document Reviewed: 09/24/2016  Elsevier Interactive Patient Education © 2019 Elsevier Inc.

## 2018-11-09 NOTE — Progress Notes (Addendum)
OFFICE VISIT NOTE   History:  Debbie Haley is a 38 y.o. [phone] here today for evaluation of heavy bleeding in the setting of probable failed IUP diagnosed on 11/05/2018. She had a HCG of 27,429 at that time. Due to language barrier, a [place] [employer] interpreter was used during the history-taking and subsequent discussion (and for part of the physical exam) with this patient. Today, she is here for follow up BHCG but reported heavy bleeding and symptoms of anemia, was soaking through several pads today.    She denies any  other concerns.    Past Medical History:  Diagnosis Date  . Kidney stones     Past Surgical History:  Procedure Laterality Date  . OTHER SURGICAL HISTORY     removal of kidney stone    The following portions of the patient's history were reviewed and updated as appropriate: allergies, current medications, past family history, past medical history, past social history, past surgical history and problem list.   Review of Systems:  Pertinent items noted in HPI and remainder of comprehensive ROS otherwise negative.  Physical Exam:  BP 138/90   Pulse 99   LMP 08/28/2018  CONSTITUTIONAL: Well-developed, well-nourished female in no acute distress.  HEENT:  Normocephalic, atraumatic. External right and left ear normal. No scleral icterus.  NECK: Normal range of motion, supple, no masses noted on observation SKIN: No rash noted. Not diaphoretic. No erythema. No pallor. MUSCULOSKELETAL: Normal range of motion. No edema noted. NEUROLOGIC: Alert and oriented to person, place, and time. Normal muscle tone coordination. No cranial nerve deficit noted. PSYCHIATRIC: Normal mood and affect. Normal behavior. Normal judgment and thought content. CARDIOVASCULAR: Normal heart rate noted RESPIRATORY: Effort and breath sounds normal, no problems with respiration noted ABDOMEN: No masses noted. No other overt distention noted.   PELVIC: Normal appearing external genitalia;  A large  amount of blood and clots noted in vault, had to clear with two 4 x 4 sponges and several scopettes.  Some ?tissue adherent to clot seen, specimen sent to pathology. Closed cervix, no tissue seen at os.  Moderate active bleeding ongoing.   Labs and Imaging Results for orders placed or performed during the hospital encounter of 11/06/18 (from the past 168 hour(s))  hCG, quantitative, pregnancy   Collection Time: 11/06/18  3:54 PM  Result Value Ref Range   hCG, Beta Chain, Quant, S 27,429 (H) <5 mIU/mL  Results for orders placed or performed during the hospital encounter of 11/05/18 (from the past 168 hour(s))  CBC   Collection Time: 11/05/18 10:32 AM  Result Value Ref Range   WBC 4.3 4.0 - 10.5 K/uL   RBC 4.46 3.87 - 5.11 MIL/uL   Hemoglobin 13.1 12.0 - 15.0 g/dL   HCT 41.4 23.9 - 53.2 %   MCV 89.5 80.0 - 100.0 fL   MCH 29.4 26.0 - 34.0 pg   MCHC 32.8 30.0 - 36.0 g/dL   RDW 02.3 34.3 - 56.8 %   Platelets 387 150 - 400 K/uL   nRBC 0.0 0.0 - 0.2 %  ABO/Rh   Collection Time: 11/05/18 10:32 AM  Result Value Ref Range   ABO/RH(D) A POS    No rh immune globuloin      NOT A RH IMMUNE GLOBULIN CANDIDATE, PT RH POSITIVE Performed at [facility], [address] [place], [place] [address]   Urinalysis, Routine w reflex microscopic   Collection Time: 11/05/18 12:11 PM  Result Value Ref Range   Color,  Urine YELLOW YELLOW   APPearance CLEAR CLEAR   Specific Gravity, Urine 1.015 1.005 - 1.030   pH 6.0 5.0 - 8.0   Glucose, UA NEGATIVE NEGATIVE mg/dL   Hgb urine dipstick SMALL (A) NEGATIVE   Bilirubin Urine NEGATIVE NEGATIVE   Ketones, ur NEGATIVE NEGATIVE mg/dL   Protein, ur NEGATIVE NEGATIVE mg/dL   Nitrite NEGATIVE NEGATIVE   Leukocytes,Ua NEGATIVE NEGATIVE   RBC / HPF 0-5 0 - 5 RBC/hpf   WBC, UA 0-5 0 - 5 WBC/hpf   Bacteria, UA NONE SEEN NONE SEEN   Squamous Epithelial / LPF 0-5 0 - 5   Mucus PRESENT    US Ob Less Than 14 Weeks With Ob Transvaginal  Result Date:  11/05/2018 CLINICAL DATA:  Vaginal bleeding in 1st trimester pregnancy. EXAM: OBSTETRIC <14 WK [place] AND TRANSVAGINAL OB US TECHNIQUE: Both transabdominal and transvaginal ultrasound examinations were performed for complete evaluation of the gestation as well as the maternal uterus, adnexal regions, and pelvic cul-de-sac. Transvaginal technique was performed to assess early pregnancy. COMPARISON:  None. FINDINGS: Intrauterine gestational sac: Single; irregular sac shape noted Yolk sac:  Visualized. Embryo:  Not Visualized. MSD: 15 mm   6 w   2 d Subchorionic hemorrhage:  None visualized. Maternal uterus/adnexae: Both ovaries are normal in appearance. No mass or abnormal free fluid identified. IMPRESSION: Single intrauterine gestational sac measuring 6 weeks 2 days by mean sac diameter. Irregular sac shape is a poor prognostic sign. Suggest correlation with serial b-hCG levels, and consider followup ultrasound to assess viability in 10 days. Electronically Signed   By: Myles Rosenthal M.D.   On: 11/05/2018 12:22     Assessment and Plan:     1. Miscarriage in early pregnancy 2. Pelvic pain 3. Vaginal bleeding in pregnancy, first trimester The nurse initially attempted to send patient to MAU at [facility] but she wanted to be evaluated here first. The plan was to get STAT ultrasound here after exam, but Radiology staff said that they could not do it as the ultrasonographer is headed to [facility] soon (after doing a scheduled scan).  Given the amount of ongoing bleeding, symptoms, going to [facility] MAU was recommended. She will be able to get STAT labs and ultrasound there, or any other intervention if her bleeding/symptoms worsen.  All orders cancelled here. MAU staff notified.     Total face-to-face time with patient: 15 minutes.  Over 50% of encounter was spent on counseling and coordination of care.   Jaynie Collins, MD, FACOG Obstetrician & Gynecologist, [facility] for [employer], [facility]

## 2018-11-09 NOTE — MAU Note (Addendum)
Pt sent from MD office, was seen in MAU previously, dx'd with threatened miscarriage.  Pt states bleeding became very heavy last night, also having abdominal pain.  Pt had bled through pants on arrival to MAU, went to BR, moderate amount of blood noted in toilet.

## 2018-11-10 ENCOUNTER — Telehealth: Payer: Self-pay | Admitting: Family Medicine

## 2018-11-10 NOTE — Telephone Encounter (Signed)
Called patient about coming in for an upcoming appointment. Used interpreter to speak to patient, but her VM was not setup.

## 2018-11-16 ENCOUNTER — Other Ambulatory Visit: Payer: Self-pay | Admitting: *Deleted

## 2018-11-16 ENCOUNTER — Other Ambulatory Visit: Payer: Self-pay

## 2018-11-16 DIAGNOSIS — O039 Complete or unspecified spontaneous abortion without complication: Secondary | ICD-10-CM

## 2018-11-17 LAB — BETA HCG QUANT (REF LAB): hCG Quant: 5281 m[IU]/mL

## 2018-11-24 ENCOUNTER — Other Ambulatory Visit: Payer: Self-pay

## 2018-11-24 ENCOUNTER — Other Ambulatory Visit: Payer: Self-pay | Admitting: General Practice

## 2018-11-24 DIAGNOSIS — O039 Complete or unspecified spontaneous abortion without complication: Secondary | ICD-10-CM

## 2018-11-24 NOTE — Progress Notes (Unsigned)
Patient arrived to office requesting bhcg results from last week. [employer] interpreter Michaelle [phone] used for encounter. Informed patient of bhcg results and discussed need for repeat bhcg today (per Luna Kitchens). Discussed follow up appt being telehealth next week on 3/31 instead of in person and patient is agreeable and provided phone number [phone].

## 2018-11-25 LAB — BETA HCG QUANT (REF LAB): hCG Quant: 1259 m[IU]/mL

## 2018-11-27 ENCOUNTER — Telehealth: Payer: Self-pay

## 2018-11-27 NOTE — Telephone Encounter (Signed)
Interperter [phone] to call patient and advise of telephone visit lvm to advise that appointment  Will be over the phone and the patient will get a call

## 2018-11-30 ENCOUNTER — Telehealth: Payer: Self-pay | Admitting: Obstetrics and Gynecology

## 2018-11-30 NOTE — Telephone Encounter (Signed)
Called the patient and informed of the virtual appointment.

## 2018-12-01 ENCOUNTER — Ambulatory Visit (INDEPENDENT_AMBULATORY_CARE_PROVIDER_SITE_OTHER): Payer: Self-pay | Admitting: Student

## 2018-12-01 ENCOUNTER — Other Ambulatory Visit: Payer: Self-pay

## 2018-12-01 DIAGNOSIS — Z789 Other specified health status: Secondary | ICD-10-CM

## 2018-12-01 DIAGNOSIS — O039 Complete or unspecified spontaneous abortion without complication: Secondary | ICD-10-CM

## 2018-12-01 DIAGNOSIS — Z5189 Encounter for other specified aftercare: Secondary | ICD-10-CM

## 2018-12-01 NOTE — Progress Notes (Signed)
TELEHEALTH VIRTUAL GYNECOLOGY VISIT ENCOUNTER NOTE  I connected with Debbie Haley on 12/01/18 at  1:55 PM EDT by telephone at home and verified that I am speaking with the correct person using two identifiers.   I discussed the limitations, risks, security and privacy concerns of performing an evaluation and management service by telephone and the availability of in person appointments. I also discussed with the patient that there may be a patient responsible charge related to this service. The patient expressed understanding and agreed to proceed.   History:  Debbie Haley is a 38 y.o. G31P3003 female being evaluated today for miscarriage follow up. She denies any abdominal pain or fever/chills.  Reports continued bleeding. Uses 1 pad per day & denies passing any clots.      Past Medical History:  Diagnosis Date  . Kidney stones    Past Surgical History:  Procedure Laterality Date  . OTHER SURGICAL HISTORY     removal of kidney stone   The following portions of the patient's history were reviewed and updated as appropriate: allergies, current medications, past family history, past medical history, past social history, past surgical history and problem list.   Review of Systems:  Pertinent items noted in HPI and remainder of comprehensive ROS otherwise negative.  Physical Exam:  Physical exam deferred due to nature of the encounter  Labs and Imaging Results for orders placed or performed in visit on 11/24/18 (from the past 336 hour(s))  Beta hCG quant (ref lab)   Collection Time: 11/24/18  1:36 PM  Result Value Ref Range   hCG Quant 1,259 mIU/mL   US Ob Transvaginal  Result Date: 11/09/2018 CLINICAL DATA:  38 y/o  F; heavy bleeding and abdominal pain. EXAM: TRANSVAGINAL OB ULTRASOUND TECHNIQUE: Transvaginal ultrasound was performed for complete evaluation of the gestation as well as the maternal uterus, adnexal regions, and pelvic cul-de-sac. COMPARISON:  11/05/2018 pelvic  ultrasound. FINDINGS: Intrauterine gestational sac: None Yolk sac:  Not Visualized. Embryo:  Not Visualized. Cardiac Activity: Not Visualized. Subchorionic hemorrhage:  None visualized. Maternal uterus/adnexae: Heterogeneous material is present within the endometrial canal which may represent blood products. Additionally there is a small fluid collection in the lower uterine segment, no fetal pole. IMPRESSION: Probable spontaneous abortion in progress. Heterogeneous material within the endometrial canal is probably blood products. Small fluid collection without fetal pole in the lower uterine segment may represent the exiting gestational sac. Electronically Signed   By: Mitzi Hansen M.D.   On: 11/09/2018 17:30   US Ob Less Than 14 Weeks With Ob Transvaginal  Result Date: 11/05/2018 CLINICAL DATA:  Vaginal bleeding in 1st trimester pregnancy. EXAM: OBSTETRIC <14 WK [place] AND TRANSVAGINAL OB US TECHNIQUE: Both transabdominal and transvaginal ultrasound examinations were performed for complete evaluation of the gestation as well as the maternal uterus, adnexal regions, and pelvic cul-de-sac. Transvaginal technique was performed to assess early pregnancy. COMPARISON:  None. FINDINGS: Intrauterine gestational sac: Single; irregular sac shape noted Yolk sac:  Visualized. Embryo:  Not Visualized. MSD: 15 mm   6 w   2 d Subchorionic hemorrhage:  None visualized. Maternal uterus/adnexae: Both ovaries are normal in appearance. No mass or abnormal free fluid identified. IMPRESSION: Single intrauterine gestational sac measuring 6 weeks 2 days by mean sac diameter. Irregular sac shape is a poor prognostic sign. Suggest correlation with serial b-hCG levels, and consider followup ultrasound to assess viability in 10 days. Electronically Signed   By: Myles Rosenthal M.D.   On: 11/05/2018 12:22  Assessment and Plan:  1. Follow-up visit after miscarriage -patient doing well -HCG continues to drop, last HCG on 3/24  down to 1200 -encouraged to take HPT in 2-3 weeks, if still positive will contact office -Present to MAU for heavy bleeding or abdominal pain if HPT still positive  2. Language barrier -phone [place] interpreter used for this visit.    I discussed the assessment and treatment plan with the patient. The patient was provided an opportunity to ask questions and all were answered. The patient agreed with the plan and demonstrated an understanding of the instructions.   The patient was advised to call back or seek an in-person evaluation/go to the ED if the symptoms worsen or if the condition fails to improve as anticipated.  I provided 10 minutes of non-face-to-face time during this encounter.   Judeth Horn, NP Center for [employer], [facility]

## 2019-10-31 IMAGING — US US OB < 14 WEEKS - US OB TV
1 series · 15 of 28 positions shown · non-contrast
Comparison: None.

CLINICAL DATA: Vaginal bleeding in 1st trimester pregnancy.

EXAM:
OBSTETRIC <14 WK US AND TRANSVAGINAL OB US
TECHNIQUE: Both transabdominal and transvaginal ultrasound examinations were
performed for complete evaluation of the gestation as well as the
maternal uterus, adnexal regions, and pelvic cul-de-sac.
Transvaginal technique was performed to assess early pregnancy.

[Series 1: us ob < 14 weeks - us ob tv · 15 of 35 slices shown]
[im 1/35]
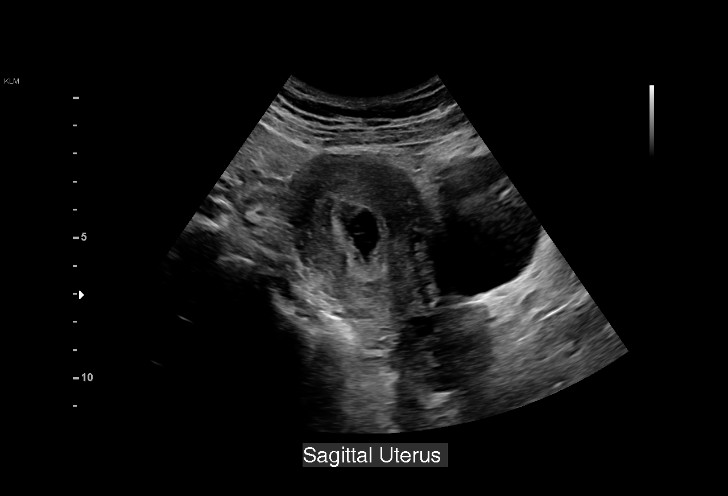
[im 3/35]
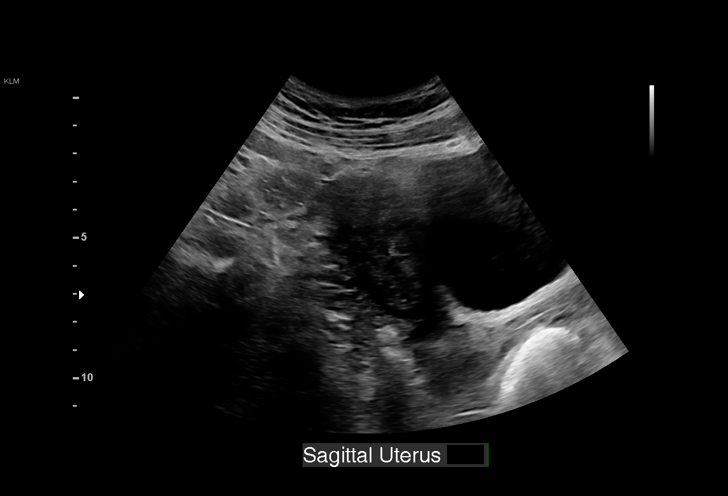
[im 6/35]
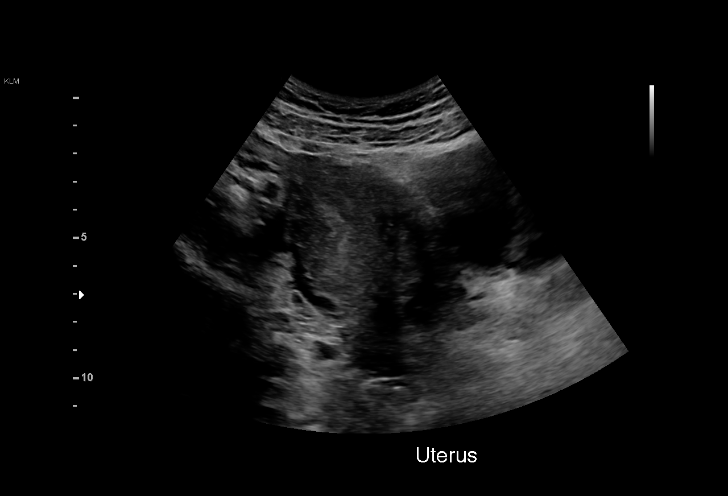
[im 8/35]
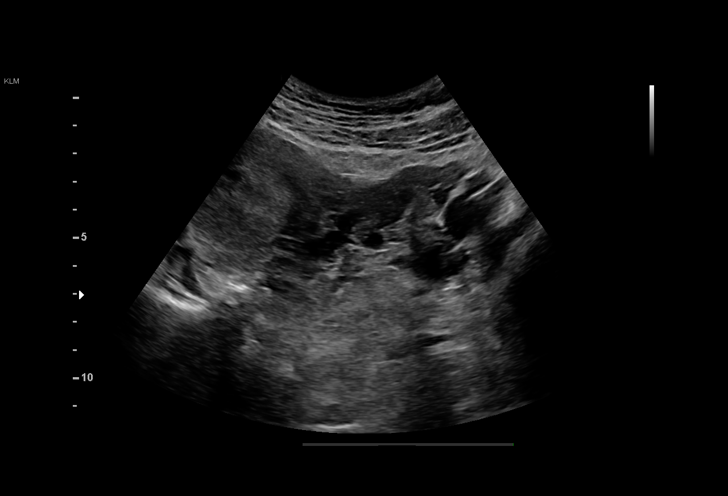
[im 11/35]
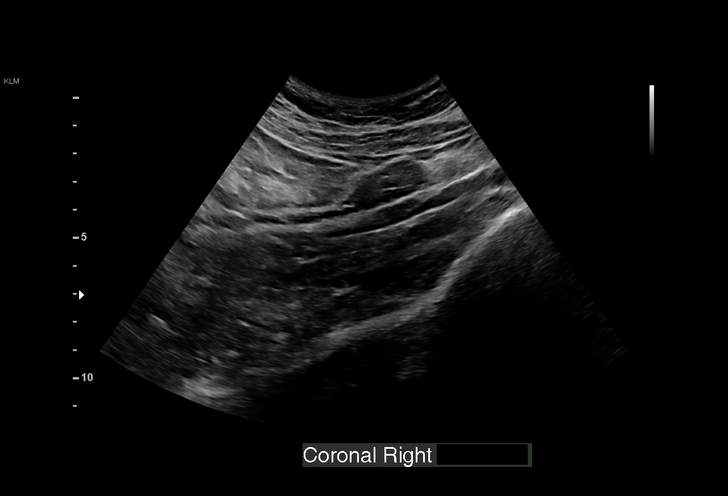
[im 13/35]
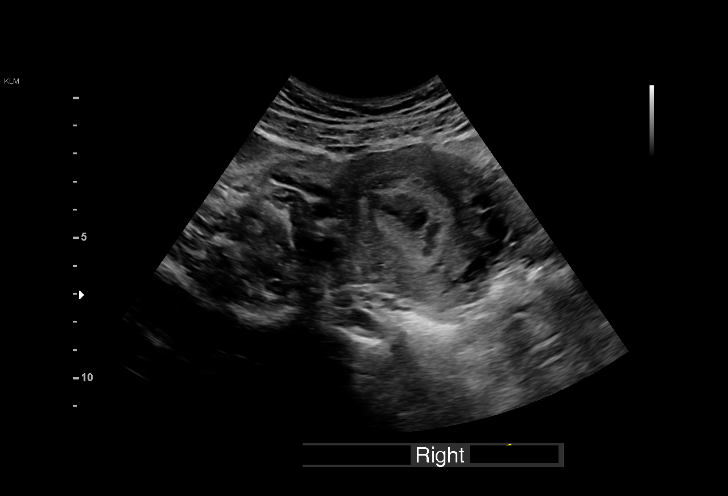
[im 16/35]
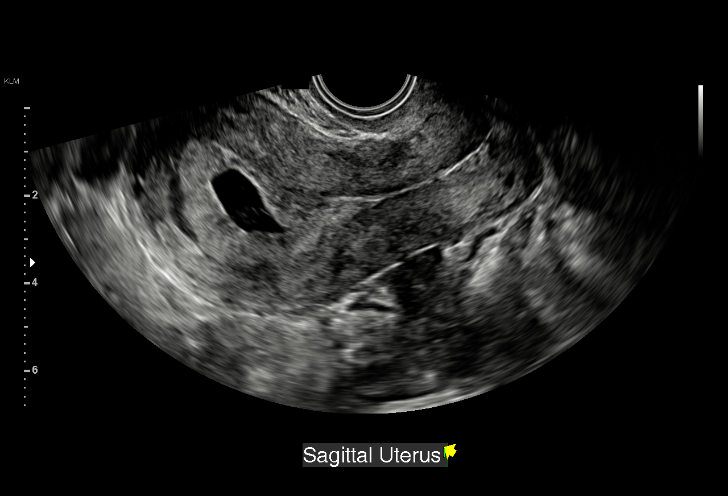
[im 18/35]
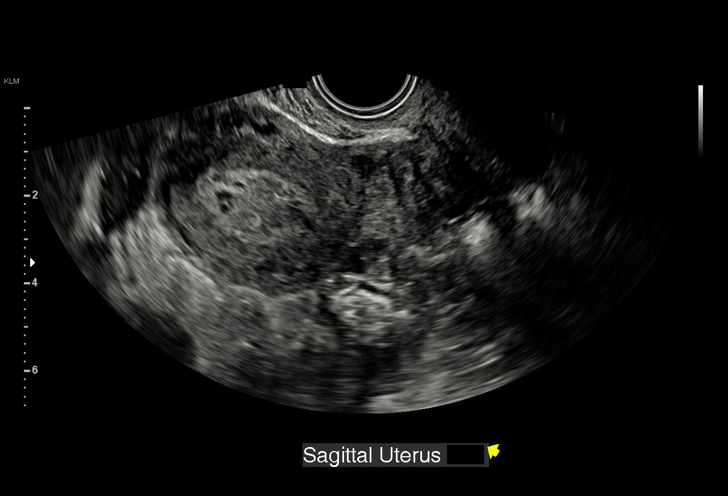
[im 19/35]
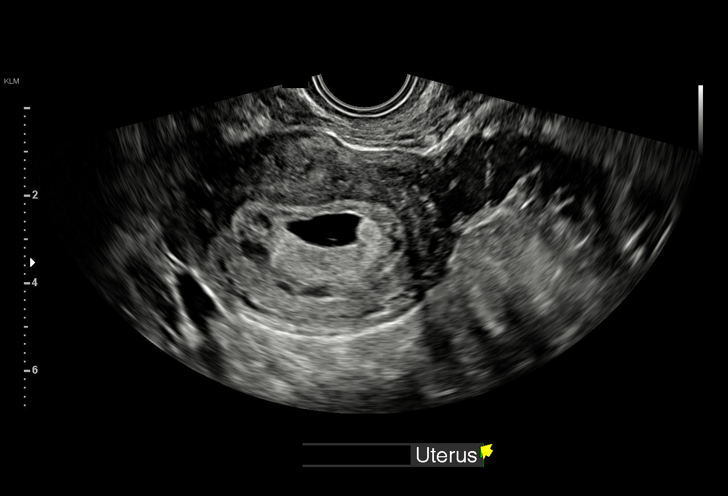
[im 22/35]
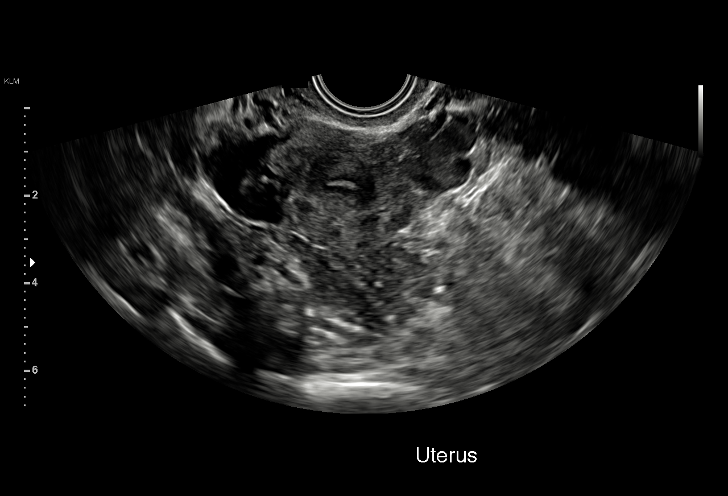
[im 24/35]
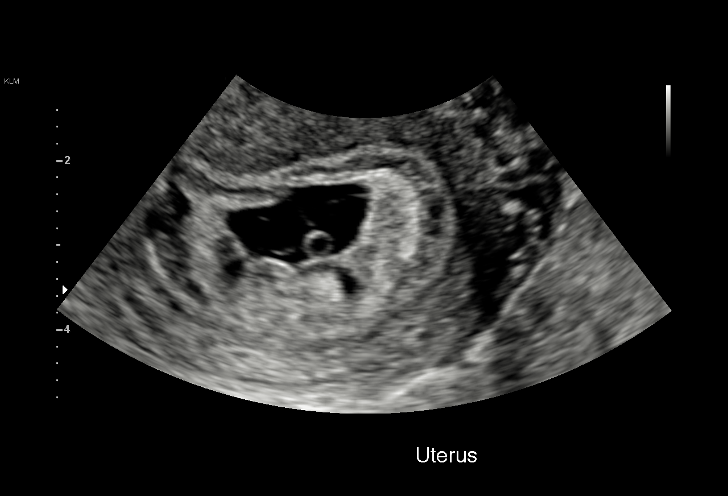
[im 27/35]
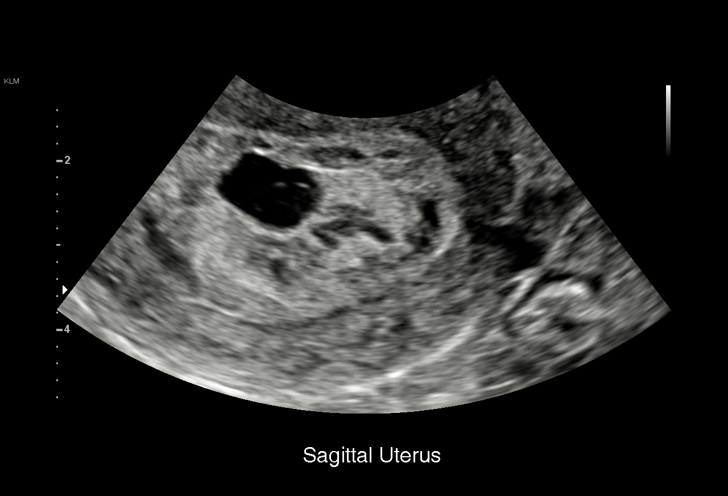
[im 29/35]
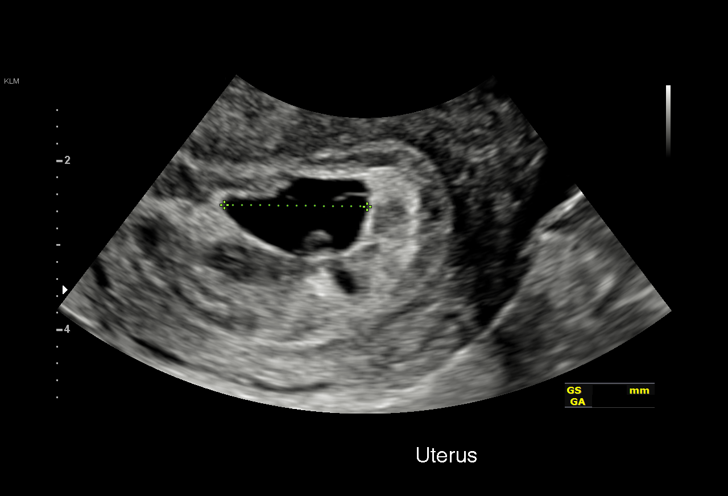
[im 32/35]
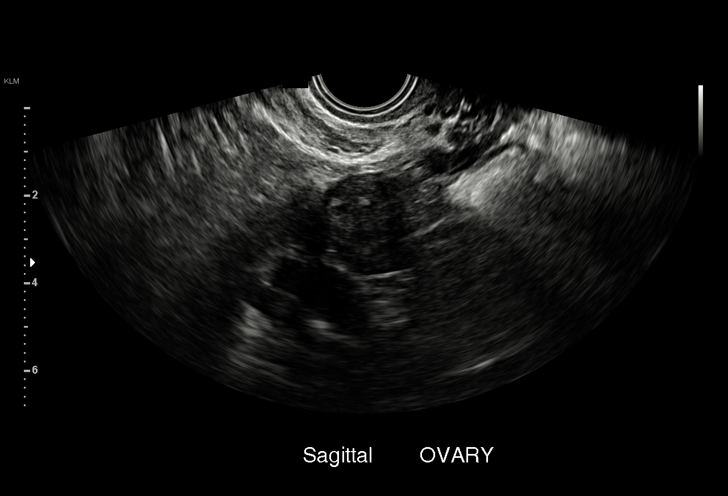
[im 35/35]
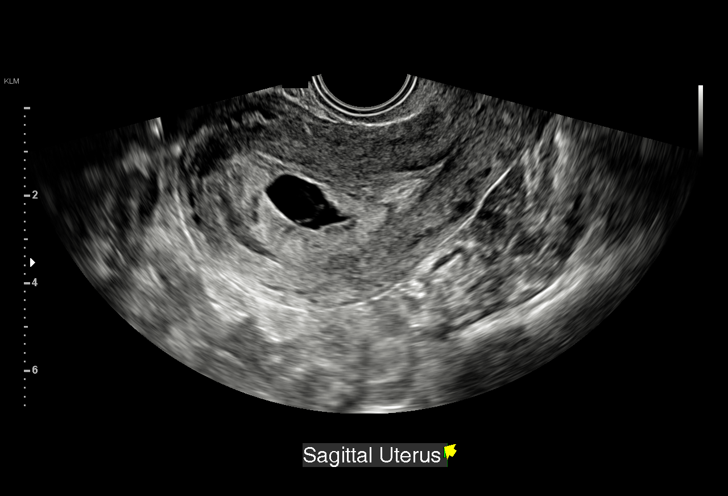

[15 of 28 positions shown; findings below may reference images not displayed]

FINDINGS: Intrauterine gestational sac: Single; irregular sac shape noted

Yolk sac:  Visualized.

Embryo:  Not Visualized.

MSD: 15 mm   6 w   2 d

Subchorionic hemorrhage:  None visualized.

Maternal uterus/adnexae: Both ovaries are normal in appearance. No
mass or abnormal free fluid identified.
IMPRESSION: Single intrauterine gestational sac measuring 6 weeks 2 days by mean
sac diameter. Irregular sac shape is a poor prognostic sign. Suggest
correlation with serial b-hCG levels, and consider followup
ultrasound to assess viability in 10 days.

## 2019-11-04 IMAGING — US TRANSVAGINAL OB ULTRASOUND
1 series · 15 of 24 positions shown · non-contrast
Comparison: 11/05/2018 pelvic ultrasound.

CLINICAL DATA: 37 y/o  F; heavy bleeding and abdominal pain.

EXAM:
TRANSVAGINAL OB ULTRASOUND
TECHNIQUE: Transvaginal ultrasound was performed for complete evaluation of the
gestation as well as the maternal uterus, adnexal regions, and
pelvic cul-de-sac.

[Series 1: transvaginal ob ultrasound · 24 acquisitions, 15 frames shown]
[im 1/24]
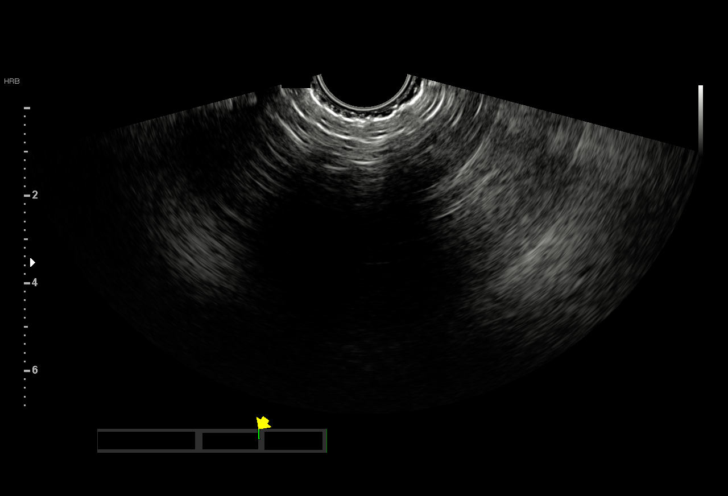
[im 3/24]
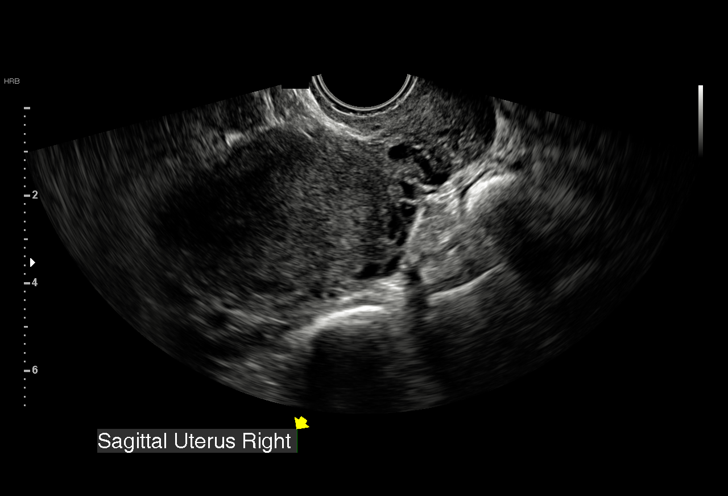
[im 5/24]
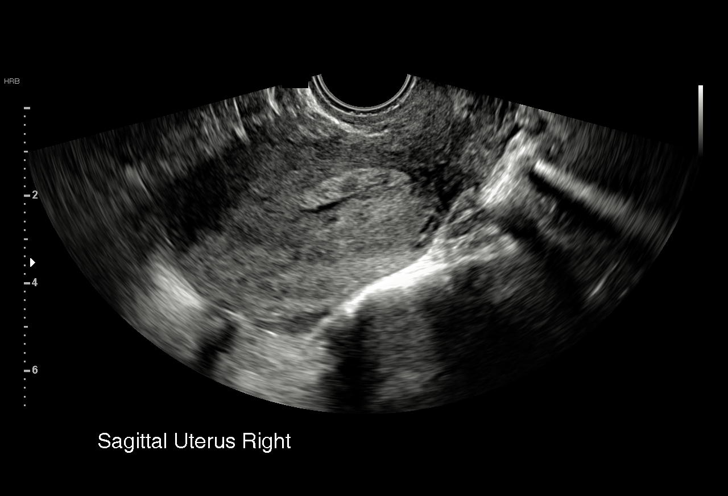
[im 6/24]
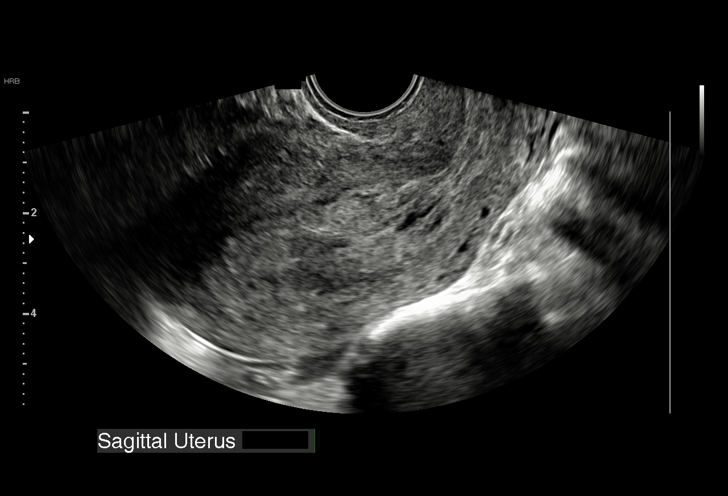
[im 8/24]
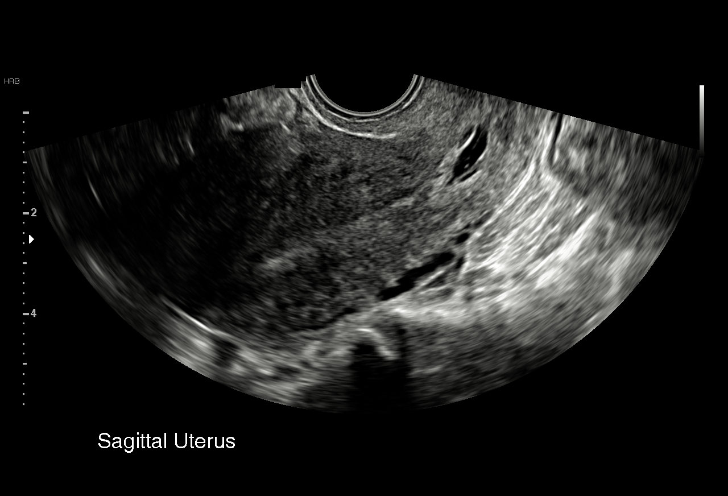
[im 9/24]
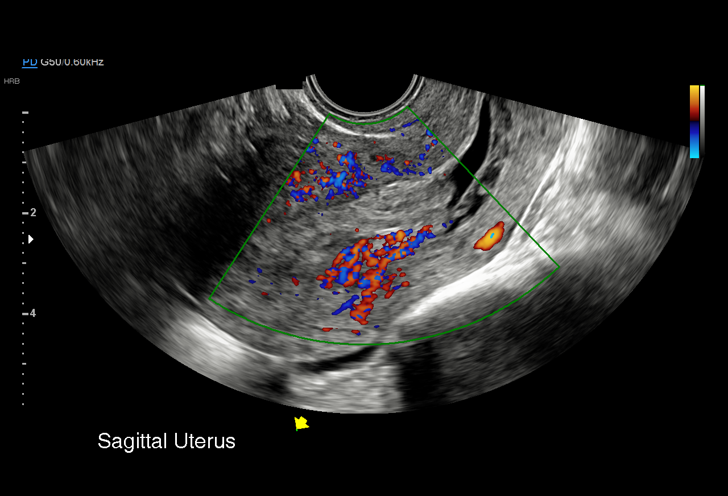
[im 11/24]
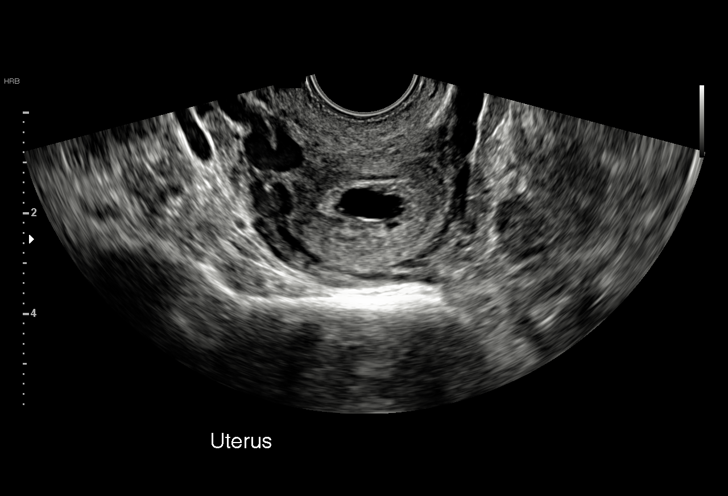
[im 13/24]
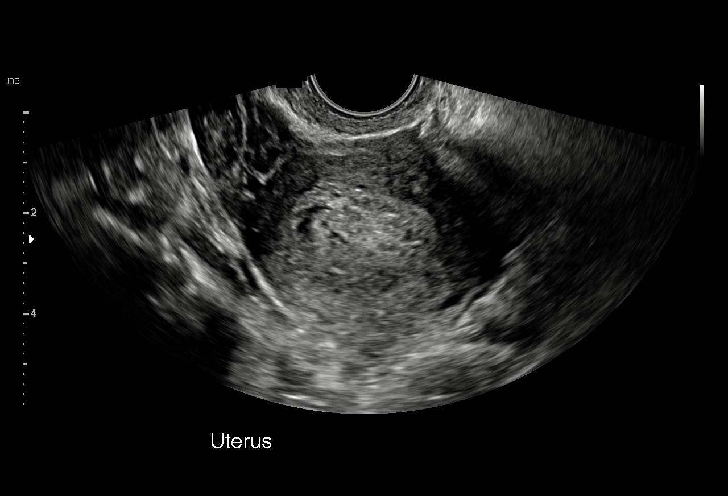
[im 14/24]
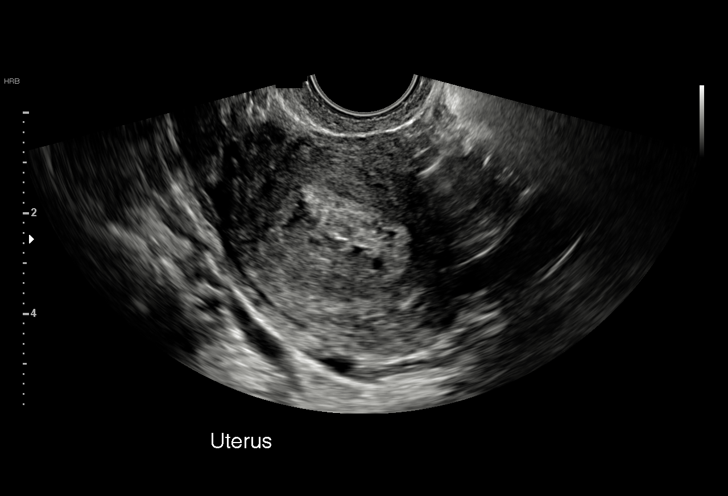
[im 16/24]
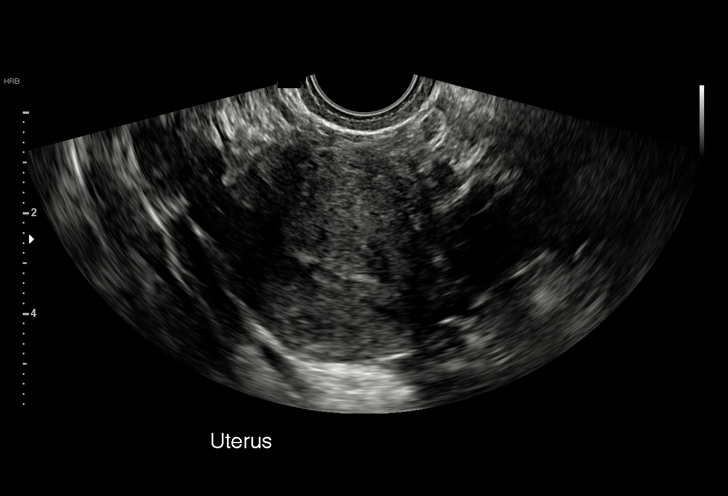
[im 17/24]
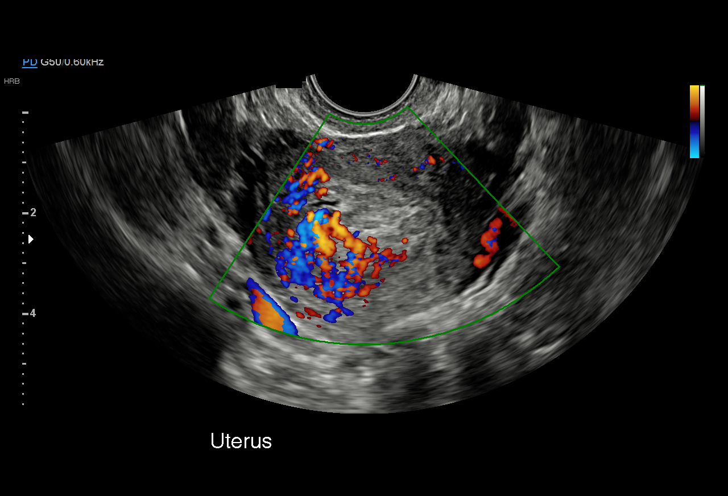
[im 19/24]
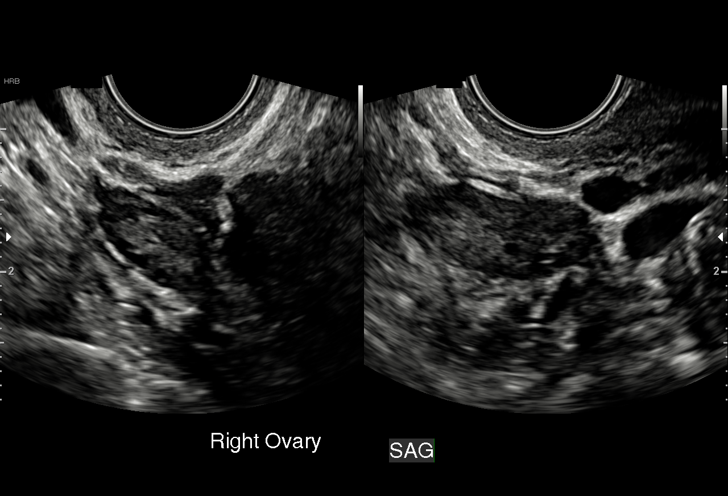
[im 21/24]
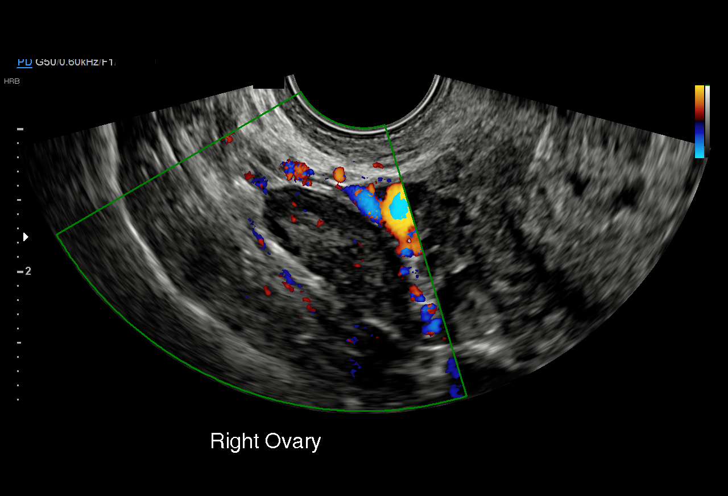
[im 22/24]
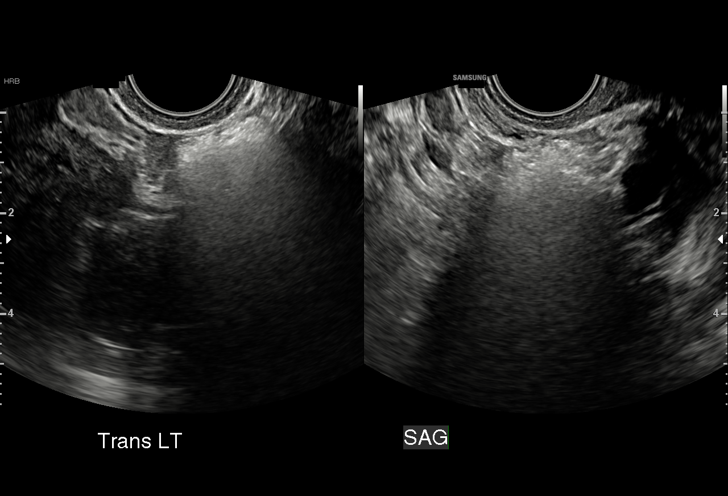
[im 24/24]
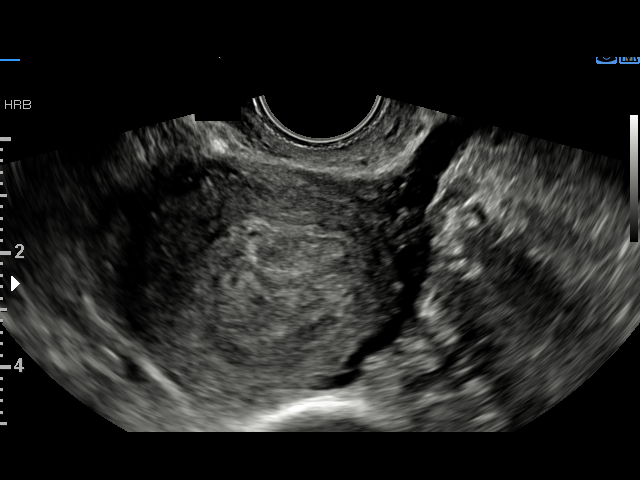

[15 of 24 positions shown; findings below may reference images not displayed]

FINDINGS: Intrauterine gestational sac: None

Yolk sac:  Not Visualized.

Embryo:  Not Visualized.

Cardiac Activity: Not Visualized.

Subchorionic hemorrhage:  None visualized.

Maternal uterus/adnexae: Heterogeneous material is present within
the endometrial canal which may represent blood products.
Additionally there is a small fluid collection in the lower uterine
segment, no fetal pole.
IMPRESSION: Probable spontaneous abortion in progress. Heterogeneous material
within the endometrial canal is probably blood products. Small fluid
collection without fetal pole in the lower uterine segment may
represent the exiting gestational sac.
